# Patient Record
Sex: Female | Born: 1995 | Race: White | Hispanic: No | Marital: Single | Smoking: Current some day smoker
Health system: Southern US, Community
[De-identification: ages and names within clinical notes are randomized; demographics above are authoritative.]

## PROBLEM LIST (undated history)

## (undated) ENCOUNTER — Emergency Department (HOSPITAL_COMMUNITY): Discharge status: Absent without leave | Payer: Self-pay

## (undated) DIAGNOSIS — Z789 Other specified health status: Secondary | ICD-10-CM

## (undated) HISTORY — PX: WISDOM TOOTH EXTRACTION: SHX21

---
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
## 2017-01-13 ENCOUNTER — Emergency Department (HOSPITAL_COMMUNITY)
Admission: EM | Admit: 2017-01-13 | Discharge: 2017-01-13 | Disposition: A | Discharge status: Home / Self Care | Payer: Medicaid Other | Attending: Dermatology | Admitting: Dermatology

## 2017-01-13 ENCOUNTER — Encounter (HOSPITAL_COMMUNITY): Payer: Self-pay | Admitting: Vascular Surgery

## 2017-01-13 DIAGNOSIS — R51 Headache: Secondary | ICD-10-CM | POA: Diagnosis present

## 2017-01-13 DIAGNOSIS — Z5321 Procedure and treatment not carried out due to patient leaving prior to being seen by health care provider: Secondary | ICD-10-CM | POA: Insufficient documentation

## 2017-01-13 NOTE — ED Notes (Signed)
Pt approached desk and asked about wait time. Pt informed of ER process and then stated "i'm going to leave and go to [facility]" Pt urged to stay by this RN. Pt left. Ambulatory.

## 2017-01-13 NOTE — ED Triage Notes (Signed)
Pt reports to the ED for eval of frontal HA x 1 week. She reports some associated nausea. Denies any active vomiting. She states that movement and leaning forward makes her HA worse. Pt reports some photophobia as well. Pt denies any hx of migraines. She has taken OTC medications without relief.

## 2021-08-17 ENCOUNTER — Other Ambulatory Visit: Payer: Self-pay

## 2021-08-17 ENCOUNTER — Inpatient Hospital Stay (HOSPITAL_COMMUNITY): Payer: Medicaid Other

## 2021-08-17 ENCOUNTER — Inpatient Hospital Stay (HOSPITAL_COMMUNITY)
Admission: AD | Admit: 2021-08-17 | Discharge: 2021-08-17 | Disposition: A | Discharge status: Home / Self Care | Payer: Medicaid Other | Attending: Family Medicine | Admitting: Family Medicine

## 2021-08-17 ENCOUNTER — Encounter (HOSPITAL_COMMUNITY): Payer: Self-pay | Admitting: *Deleted

## 2021-08-17 DIAGNOSIS — F17210 Nicotine dependence, cigarettes, uncomplicated: Secondary | ICD-10-CM | POA: Insufficient documentation

## 2021-08-17 DIAGNOSIS — Z3A01 Less than 8 weeks gestation of pregnancy: Secondary | ICD-10-CM | POA: Diagnosis not present

## 2021-08-17 DIAGNOSIS — R109 Unspecified abdominal pain: Secondary | ICD-10-CM | POA: Diagnosis not present

## 2021-08-17 DIAGNOSIS — O26891 Other specified pregnancy related conditions, first trimester: Secondary | ICD-10-CM | POA: Insufficient documentation

## 2021-08-17 DIAGNOSIS — O99331 Smoking (tobacco) complicating pregnancy, first trimester: Secondary | ICD-10-CM | POA: Insufficient documentation

## 2021-08-17 DIAGNOSIS — O3680X0 Pregnancy with inconclusive fetal viability, not applicable or unspecified: Secondary | ICD-10-CM

## 2021-08-17 DIAGNOSIS — O209 Hemorrhage in early pregnancy, unspecified: Secondary | ICD-10-CM | POA: Diagnosis not present

## 2021-08-17 DIAGNOSIS — O26899 Other specified pregnancy related conditions, unspecified trimester: Secondary | ICD-10-CM

## 2021-08-17 HISTORY — DX: Other specified health status: Z78.9

## 2021-08-17 LAB — WET PREP, GENITAL
Clue Cells Wet Prep HPF POC: NONE SEEN
Sperm: NONE SEEN
Trich, Wet Prep: NONE SEEN
WBC, Wet Prep HPF POC: >=10 — AB (ref <10)
Yeast Wet Prep HPF POC: NONE SEEN

## 2021-08-17 LAB — URINALYSIS, MICROSCOPIC (REFLEX): RBC / HPF: >50 RBC/hpf (ref 0–5)

## 2021-08-17 LAB — CBC
HCT: 41.2 % (ref 36.0–46.0)
Hemoglobin: 13.6 g/dL (ref 12.0–15.0)
MCH: 28.6 pg (ref 26.0–34.0)
MCHC: 33 g/dL (ref 30.0–36.0)
MCV: 86.7 fL (ref 80.0–100.0)
Platelets: 443 10*3/uL — ABNORMAL HIGH (ref 150–400)
RBC: 4.75 MIL/uL (ref 3.87–5.11)
RDW: 12.5 % (ref 11.5–15.5)
WBC: 10.8 10*3/uL — ABNORMAL HIGH (ref 4.0–10.5)
nRBC: 0 % (ref 0.0–0.2)

## 2021-08-17 LAB — URINALYSIS, ROUTINE W REFLEX MICROSCOPIC
Bilirubin Urine: NEGATIVE
Glucose, UA: NEGATIVE mg/dL
Ketones, ur: NEGATIVE mg/dL
Leukocytes,Ua: NEGATIVE
Nitrite: NEGATIVE
Protein, ur: 30 mg/dL — AB
Specific Gravity, Urine: 1.025 (ref 1.005–1.030)
pH: 6.5 (ref 5.0–8.0)

## 2021-08-17 LAB — ABO/RH: ABO/RH(D): O POS

## 2021-08-17 LAB — POCT PREGNANCY, URINE: Preg Test, Ur: POSITIVE — AB

## 2021-08-17 LAB — HCG, QUANTITATIVE, PREGNANCY: hCG, Beta Chain, Quant, S: 58 m[IU]/mL — ABNORMAL HIGH (ref <5)

## 2021-08-17 NOTE — MAU Provider Note (Signed)
History     CSN: [id number]  Arrival date and time: 08/17/21 1159   Event Date/Time   First Provider Initiated Contact with Patient 08/17/21 1303      Chief Complaint  Patient presents with   Abdominal Pain   Vaginal Bleeding   HPI Alexandria Lyons is a 25 y.o. G1P0 at [redacted]w[redacted]d who presents with vaginal bleeding & abdominal cramping. Has had intermittent spotting for the last 3 days that has increased this morning. Reports bright red blood that saturated a pad within 2 hours. Not as much bleeding currently but reports that she has a tampon in.  Increase in cramping this morning. Pain throughout lower abdomen that she rates 5/10. Hasn't treated symptoms. Nothing makes pain worse. Denies n/v/d, fever, dysuria, or vaginal discharge.   OB History     Gravida  1   Para      Term      Preterm      AB      Living         SAB      IAB      Ectopic      Multiple      Live Births              Past Medical History:  Diagnosis Date   Medical history non-contributory     Past Surgical History:  Procedure Laterality Date   WISDOM TOOTH EXTRACTION      No family history on file.  Social History   Tobacco Use   Smoking status: Some Days    Types: Cigarettes   Smokeless tobacco: Never  Substance Use Topics   Alcohol use: Yes    Comment: occasionally   Drug use: No    Allergies: No Known Allergies  Medications Prior to Admission  Medication Sig Dispense Refill Last Dose   Prenatal Vit-Fe Fumarate-FA (MULTIVITAMIN-PRENATAL) 27-0.8 MG TABS tablet Take 1 tablet by mouth daily at 12 noon.       Review of Systems  Constitutional: Negative.   Gastrointestinal:  Positive for abdominal pain.  Genitourinary:  Positive for vaginal bleeding.  Physical Exam   Blood pressure 136/75, pulse 81, temperature 98.1 F (36.7 C), temperature source Oral, resp. rate 19, height 5\' 3"  (1.6 m), weight 90.4 kg, last menstrual period 07/14/2021, SpO2 98 %.  Physical  Exam Vitals and nursing note reviewed. Exam conducted with a chaperone present.  Constitutional:      General: She is not in acute distress.    Appearance: She is well-developed.  HENT:     Head: Normocephalic and atraumatic.  Eyes:     General: No scleral icterus.    Pupils: Pupils are equal, round, and reactive to light.  Pulmonary:     Effort: Pulmonary effort is normal. No respiratory distress.  Abdominal:     General: Abdomen is flat.     Palpations: Abdomen is soft.     Tenderness: There is no abdominal tenderness. There is no guarding or rebound.  Genitourinary:    General: Normal vulva.     Exam position: Lithotomy position.     Uterus: Normal.      Adnexa: Right adnexa normal and left adnexa normal.     Comments: Small amount of dark red bleeding coming from os. Cervix pink/smooth. Cervix closed.  Skin:    General: Skin is warm and dry.  Neurological:     General: No focal deficit present.     Mental Status: She is alert.  Psychiatric:        Mood and Affect: Mood normal.        Behavior: Behavior normal.    MAU Course  Procedures Results for orders placed or performed during the hospital encounter of 08/17/21 (from the past 24 hour(s))  Pregnancy, urine POC     Status: Abnormal   Collection Time: 08/17/21 12:50 PM  Result Value Ref Range   Preg Test, Ur POSITIVE (A) NEGATIVE  CBC     Status: Abnormal   Collection Time: 08/17/21  1:31 PM  Result Value Ref Range   WBC 10.8 (H) 4.0 - 10.5 K/uL   RBC 4.75 3.87 - 5.11 MIL/uL   Hemoglobin 13.6 12.0 - 15.0 g/dL   HCT 97.6 73.4 - 19.3 %   MCV 86.7 80.0 - 100.0 fL   MCH 28.6 26.0 - 34.0 pg   MCHC 33.0 30.0 - 36.0 g/dL   RDW 79.0 24.0 - 97.3 %   Platelets 443 (H) 150 - 400 K/uL   nRBC 0.0 0.0 - 0.2 %  ABO/Rh     Status: None   Collection Time: 08/17/21  1:31 PM  Result Value Ref Range   ABO/RH(D) O POS    No rh immune globuloin      NOT A RH IMMUNE GLOBULIN CANDIDATE, PT RH POSITIVE Performed at [facility], [address], [place], [place] [address]   hCG, quantitative, pregnancy     Status: Abnormal   Collection Time: 08/17/21  1:31 PM  Result Value Ref Range   hCG, Beta Chain, Quant, S 58 (H) <5 mIU/mL  Wet prep, genital     Status: Abnormal   Collection Time: 08/17/21  1:51 PM   Specimen: PATH Cytology Cervicovaginal Ancillary Only  Result Value Ref Range   Yeast Wet Prep HPF POC NONE SEEN NONE SEEN   Trich, Wet Prep NONE SEEN NONE SEEN   Clue Cells Wet Prep HPF POC NONE SEEN NONE SEEN   WBC, Wet Prep HPF POC >=10 (A) <10   Sperm NONE SEEN    US OB LESS THAN 14 WEEKS WITH OB TRANSVAGINAL  Result Date: 08/17/2021 CLINICAL DATA:  Vaginal bleeding, cramping EXAM: OBSTETRIC <14 WK [place] AND TRANSVAGINAL OB US TECHNIQUE: Both transabdominal and transvaginal ultrasound examinations were performed for complete evaluation of the gestation as well as the maternal uterus, adnexal regions, and pelvic cul-de-sac. Transvaginal technique was performed to assess early pregnancy. COMPARISON:  None. FINDINGS: Intrauterine gestational sac: None Yolk sac:  Not Visualized. Embryo:  Not Visualized. Cardiac Activity: Not Visualized. Heart Rate: Not applicable MSD: Not applicable CRL:  Not applicable Subchorionic hemorrhage:  Not applicable Maternal uterus/adnexae: Normal bilateral ovaries. Trace simple free fluid in pelvis. IMPRESSION: No intrauterine gestational sac visualized, consistent with pregnancy of unknown location in this patient with positive urine pregnancy test. Recommend trending quantitative beta HCG, close follow-up with OBGYN, and repeat ultrasound as clinically indicated. Electronically Signed   By: Caprice Renshaw M.D.   On: 08/17/2021 14:33    MDM +UPT UA, wet prep, GC/chlamydia, CBC, ABO/Rh, quant hCG, and [place] today to rule out ectopic pregnancy which can be life threatening.   RH positive. Small amount of bleeding on exam. Benign abdominal exam.   Ultrasound shows no IUP or adnexal mass.  HCG today is 58.   Discussed with patient the diagnosis of pregnancy of unknown anatomic location.  Three possibilities of outcome are: a healthy pregnancy that is too early to see a yolk sac  to confirm the pregnancy is in the uterus, a pregnancy that is not healthy and has not developed and will not develop, and an ectopic pregnancy that cannot be identified at this time. She is aware that she needs to follow-up Monday in the office repeat beta-hCG level to determine next steps. All questions were answered, MAU precautions discussed.  Assessment and Plan   1. Pregnancy of unknown anatomic location   2. Vaginal bleeding in pregnancy, first trimester   3. Abdominal cramping affecting pregnancy   4. [redacted] weeks gestation of pregnancy    -pelvic rest -reviewed SAB & ectopic precautions -scheduled for stat HCG in office on Monday -GC/CT pending  Judeth Horn 08/17/2021, 2:48 PM

## 2021-08-17 NOTE — MAU Note (Signed)
Presents with c/o VB and cramping that began this morning.  Reports saturating a sanitary napkin every 2 hours, denies passing clots.  LMP 07/14/2021

## 2021-08-17 NOTE — Discharge Instructions (Signed)
Return to care  If you have heavier bleeding that soaks through more than 2 pads per hour for an hour or more If you bleed so much that you feel like you might pass out or you do pass out If you have significant abdominal pain that is not improved with Tylenol   

## 2021-08-20 ENCOUNTER — Other Ambulatory Visit: Payer: Self-pay

## 2021-08-20 ENCOUNTER — Ambulatory Visit (INDEPENDENT_AMBULATORY_CARE_PROVIDER_SITE_OTHER): Payer: Self-pay

## 2021-08-20 VITALS — BP 133/89 | HR 87 | Wt 195.4 lb

## 2021-08-20 DIAGNOSIS — O3680X0 Pregnancy with inconclusive fetal viability, not applicable or unspecified: Secondary | ICD-10-CM

## 2021-08-20 LAB — BETA HCG QUANT (REF LAB): hCG Quant: 17 m[IU]/mL

## 2021-08-20 LAB — GC/CHLAMYDIA PROBE AMP (~~LOC~~) NOT AT ARMC
Chlamydia: NEGATIVE
Comment: NEGATIVE
Comment: NORMAL
Neisseria Gonorrhea: NEGATIVE

## 2021-08-20 NOTE — Progress Notes (Signed)
Beta HCG Follow-up Visit  Alexandria Lyons presents to [facility] for follow-up beta HCG lab. She was seen in MAU for  vaginal bleeding and abdominal cramping  on 08/17/21. Patient reports bleeding today like a period. Discussed with patient that we are following beta HCG levels today. Results will be back in approximately 2 hours. Valid contact number for patient confirmed. I will call the patient with results.   Beta HCG results: 08/17/21 @ 1331 58  08/20/21 @ 0916 17   Results and patient history reviewed with Para March, MD, who states this appears to be a miscarriage and patient should follow up in 1 week with non stat beta HCG. Patient called and informed of plan for follow-up. Lab appt scheduled for 08/27/21. Front office notified to schedule provider follow-up  Marjo Bicker 08/20/2021 9:29 AM

## 2021-08-22 ENCOUNTER — Other Ambulatory Visit: Payer: Self-pay | Admitting: *Deleted

## 2021-08-22 DIAGNOSIS — O039 Complete or unspecified spontaneous abortion without complication: Secondary | ICD-10-CM

## 2021-08-27 ENCOUNTER — Other Ambulatory Visit: Payer: Medicaid Other

## 2021-08-27 ENCOUNTER — Other Ambulatory Visit: Payer: Self-pay

## 2021-08-27 DIAGNOSIS — O039 Complete or unspecified spontaneous abortion without complication: Secondary | ICD-10-CM

## 2021-08-28 LAB — BETA HCG QUANT (REF LAB): hCG Quant: 19 m[IU]/mL

## 2021-09-07 ENCOUNTER — Ambulatory Visit: Payer: Medicaid Other | Admitting: Obstetrics and Gynecology

## 2021-09-11 ENCOUNTER — Ambulatory Visit: Payer: Medicaid Other | Admitting: Student

## 2022-12-06 IMAGING — US US OB < 14 WEEKS - US OB TV
1 series · 15 of 28 positions shown · non-contrast
Comparison: None.

CLINICAL DATA: Vaginal bleeding, cramping

EXAM:
OBSTETRIC <14 WK US AND TRANSVAGINAL OB US
TECHNIQUE: Both transabdominal and transvaginal ultrasound examinations were
performed for complete evaluation of the gestation as well as the
maternal uterus, adnexal regions, and pelvic cul-de-sac.
Transvaginal technique was performed to assess early pregnancy.

[Series 1: us ob < 14 weeks - us ob tv · 75 acquisitions, 15 frames shown]
[im 1/75]
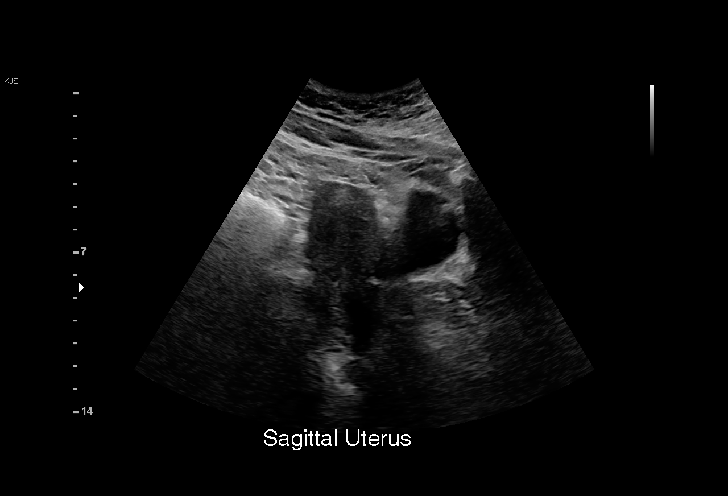
[im 6/75]
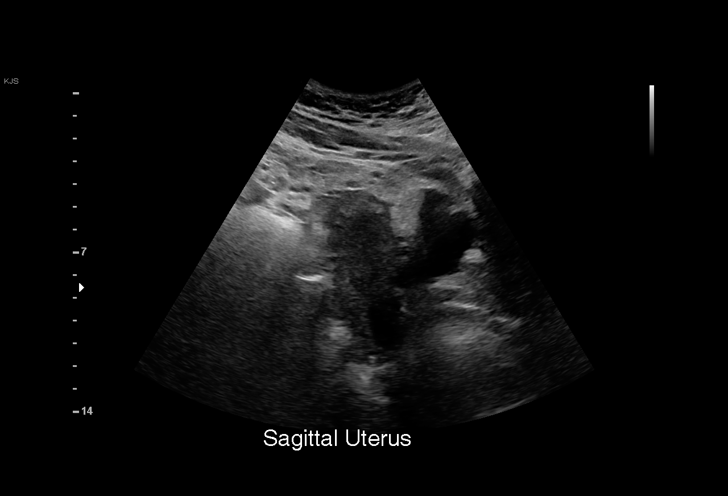
[im 11/75]
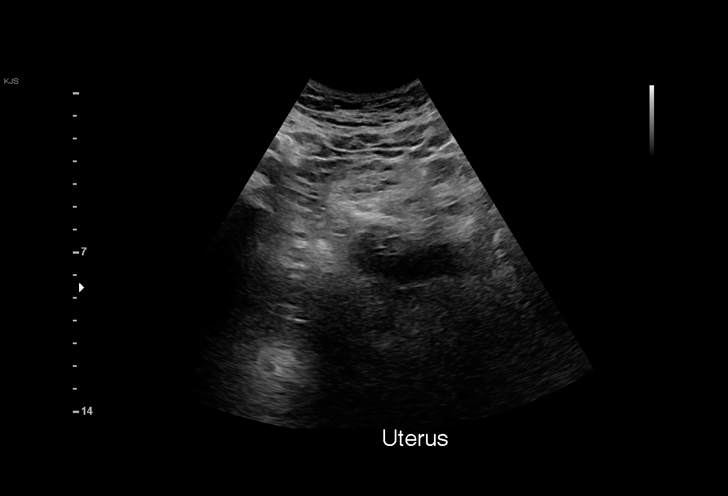
[im 17/75]
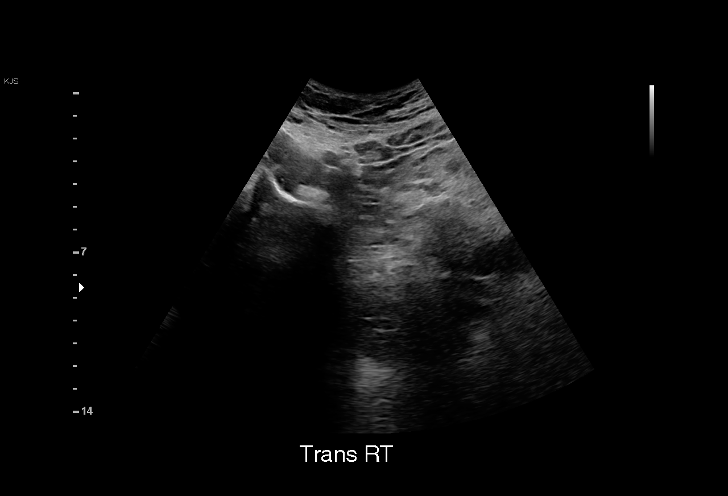
[im 22/75]
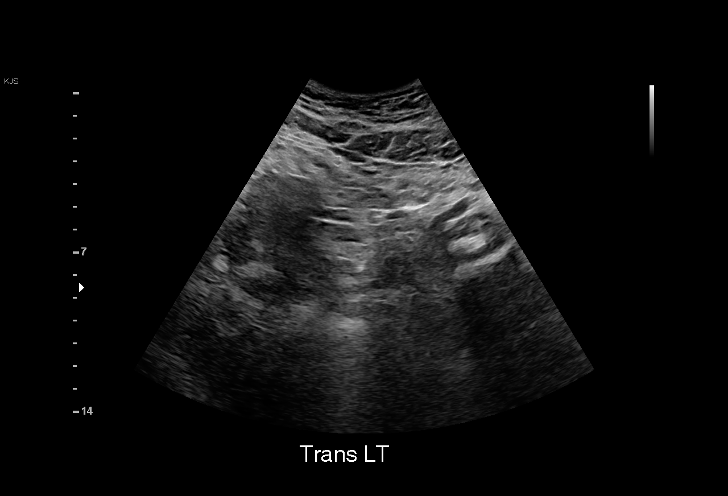
[im 28/75]
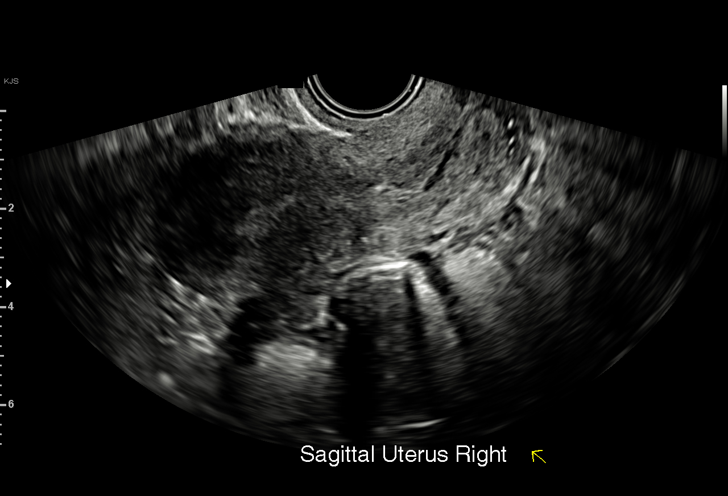
[im 33/75]
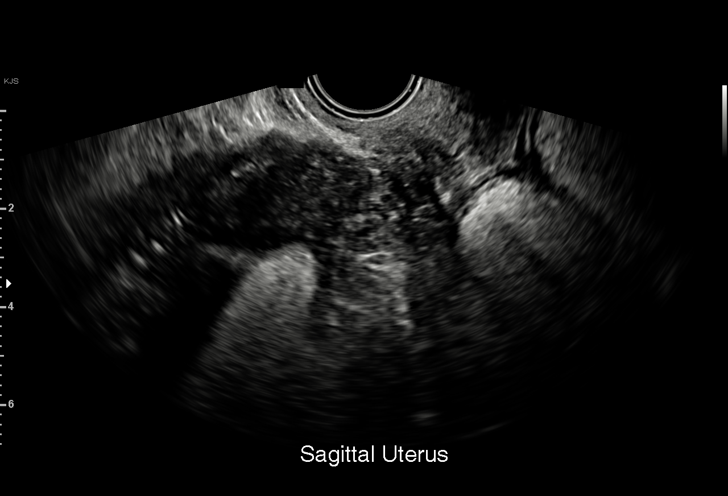
[im 39/75]
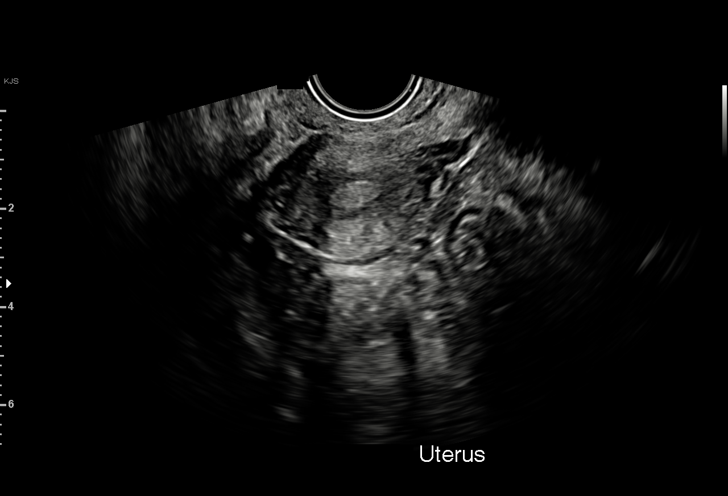
[im 42/75]
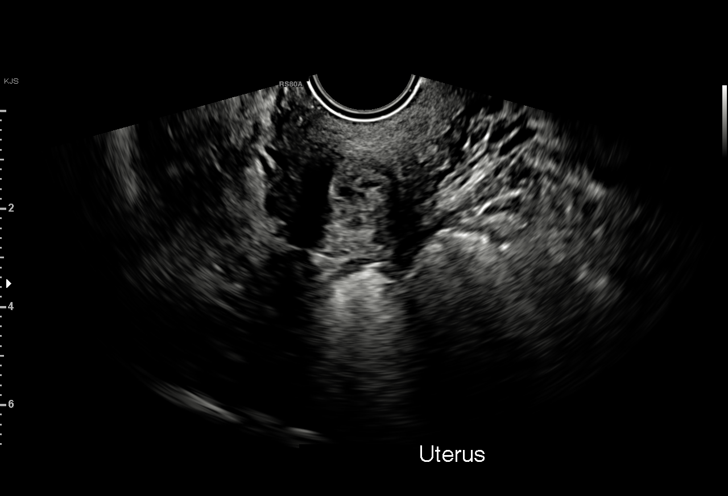
[im 47/75]
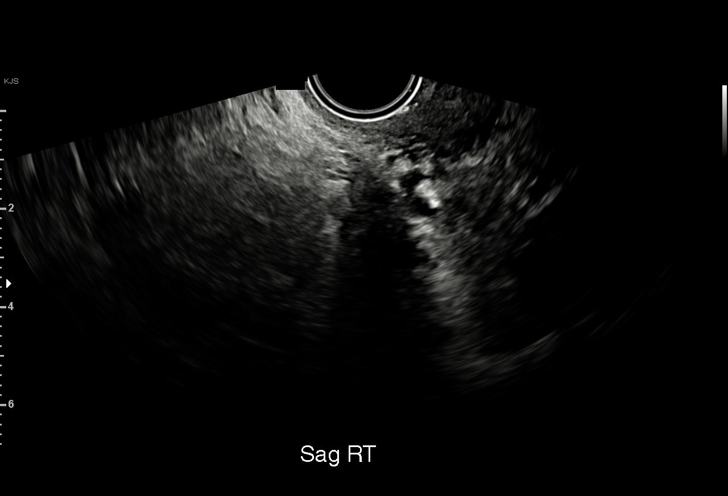
[im 53/75]
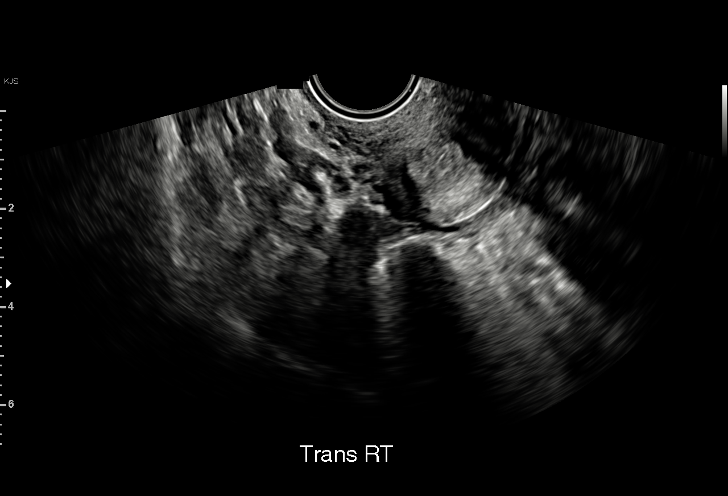
[im 58/75]
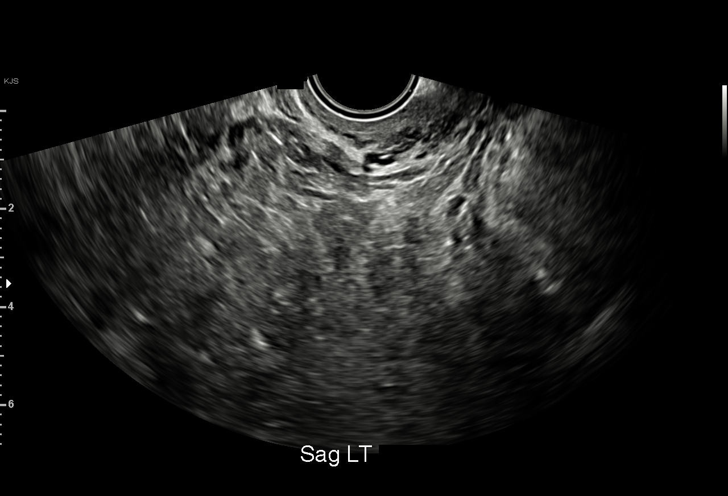
[im 64/75]
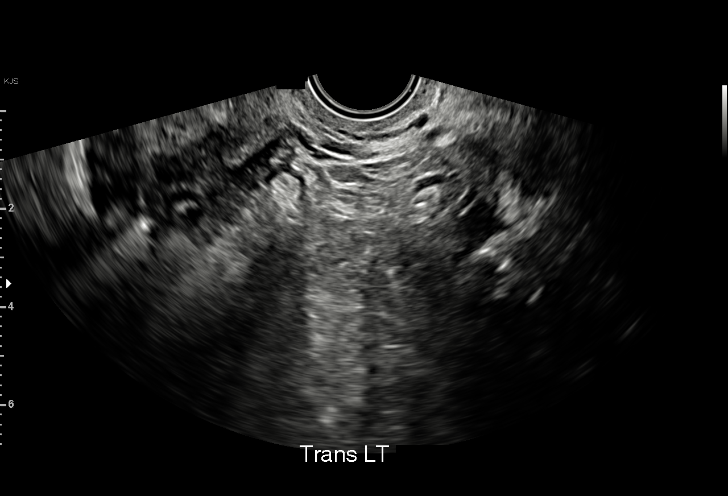
[im 69/75]
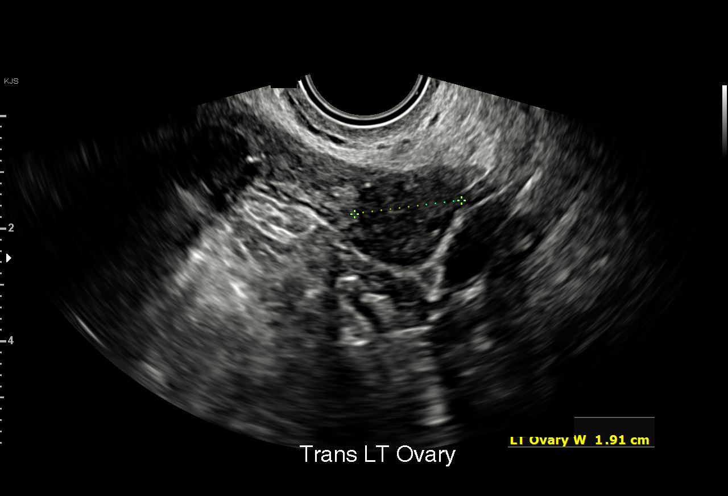
[im 75/75]
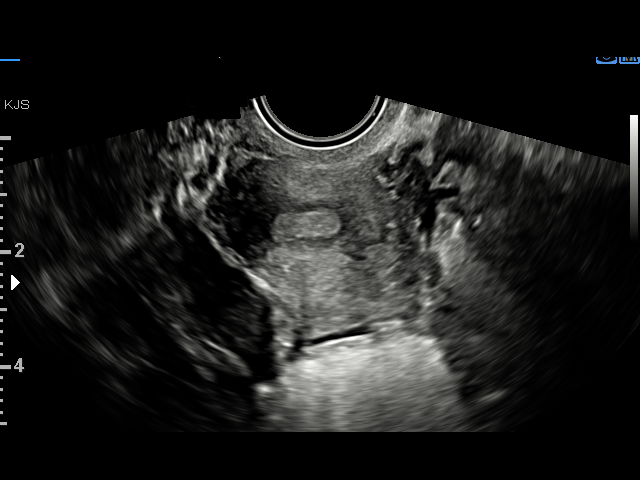

[15 of 28 positions shown; findings below may reference images not displayed]

FINDINGS: Intrauterine gestational sac: None

Yolk sac:  Not Visualized.

Embryo:  Not Visualized.

Cardiac Activity: Not Visualized.

Heart Rate: Not applicable

MSD: Not applicable

CRL:  Not applicable

Subchorionic hemorrhage:  Not applicable

Maternal uterus/adnexae: Normal bilateral ovaries. Trace simple free
fluid in pelvis.
IMPRESSION: No intrauterine gestational sac visualized, consistent with
pregnancy of unknown location in this patient with positive urine
pregnancy test. Recommend trending quantitative beta HCG, close
follow-up with OBGYN, and repeat ultrasound as clinically indicated.
